# Patient Record
Sex: Male | Born: 1960 | Race: White | Hispanic: No | Marital: Single | Smoking: Former smoker
Health system: Southern US, Community
[De-identification: ages and names within clinical notes are randomized; demographics above are authoritative.]

## PROBLEM LIST (undated history)

## (undated) DIAGNOSIS — I10 Essential (primary) hypertension: Secondary | ICD-10-CM

## (undated) DIAGNOSIS — K219 Gastro-esophageal reflux disease without esophagitis: Secondary | ICD-10-CM

## (undated) HISTORY — PX: OTHER SURGICAL HISTORY: SHX169

## (undated) HISTORY — DX: Essential (primary) hypertension: I10

---

## 2020-11-22 DIAGNOSIS — I10 Essential (primary) hypertension: Secondary | ICD-10-CM | POA: Diagnosis not present

## 2020-11-22 DIAGNOSIS — F5221 Male erectile disorder: Secondary | ICD-10-CM | POA: Diagnosis not present

## 2020-11-22 DIAGNOSIS — Z7189 Other specified counseling: Secondary | ICD-10-CM | POA: Diagnosis not present

## 2020-11-29 DIAGNOSIS — I10 Essential (primary) hypertension: Secondary | ICD-10-CM | POA: Diagnosis not present

## 2020-12-06 DIAGNOSIS — F5221 Male erectile disorder: Secondary | ICD-10-CM | POA: Diagnosis not present

## 2020-12-06 DIAGNOSIS — I10 Essential (primary) hypertension: Secondary | ICD-10-CM | POA: Diagnosis not present

## 2021-03-07 DIAGNOSIS — I10 Essential (primary) hypertension: Secondary | ICD-10-CM | POA: Diagnosis not present

## 2021-03-14 DIAGNOSIS — I10 Essential (primary) hypertension: Secondary | ICD-10-CM | POA: Diagnosis not present

## 2021-03-14 DIAGNOSIS — E669 Obesity, unspecified: Secondary | ICD-10-CM | POA: Diagnosis not present

## 2021-03-14 DIAGNOSIS — Z6834 Body mass index (BMI) 34.0-34.9, adult: Secondary | ICD-10-CM | POA: Diagnosis not present

## 2021-03-14 DIAGNOSIS — F5221 Male erectile disorder: Secondary | ICD-10-CM | POA: Diagnosis not present

## 2021-03-20 ENCOUNTER — Encounter: Payer: Self-pay | Admitting: *Deleted

## 2021-04-06 ENCOUNTER — Ambulatory Visit (INDEPENDENT_AMBULATORY_CARE_PROVIDER_SITE_OTHER): Payer: Self-pay | Admitting: *Deleted

## 2021-04-06 ENCOUNTER — Encounter: Payer: Self-pay | Admitting: *Deleted

## 2021-04-06 ENCOUNTER — Other Ambulatory Visit: Payer: Self-pay

## 2021-04-06 VITALS — Ht 71.0 in | Wt 247.2 lb

## 2021-04-06 DIAGNOSIS — Z1211 Encounter for screening for malignant neoplasm of colon: Secondary | ICD-10-CM

## 2021-04-06 MED ORDER — PEG 3350-KCL-NA BICARB-NACL 420 G PO SOLR: 4000.0000 mL | Freq: Once | ORAL | 0 refills | Status: AC

## 2021-04-06 NOTE — Progress Notes (Signed)
Gastroenterology Pre-Procedure Review  Request Date: 04/06/2021 Requesting Physician: Dr. Nevada Crane, no previous TCS, family hx of colon cancer (uncle)  PATIENT REVIEW QUESTIONS: The patient responded to the following health history questions as indicated:    1. Diabetes Melitis: no 2. Joint replacements in the past 12 months: no 3. Major health problems in the past 3 months: yes, elevated blood pressure, PCP is managing 4. Has an artificial valve or MVP: no 5. Has a defibrillator: no 6. Has been advised in past to take antibiotics in advance of a procedure like teeth cleaning: no 7. Family history of colon cancer: yes, uncle: age unknown 55. Alcohol Use: no 9. Illicit drug Use: no 10. History of sleep apnea: no  11. History of coronary artery or other vascular stents placed within the last 12 months: no 12. History of any prior anesthesia complications: no 13. Body mass index is 34.48 kg/m.    MEDICATIONS & ALLERGIES:    Patient reports the following regarding taking any blood thinners:   Plavix? no Aspirin? Yes, 325 mg daily Coumadin? no Brilinta? no Xarelto? no Eliquis? no Pradaxa? no Savaysa? no Effient? no  Patient confirms/reports the following medications:  Current Outpatient Medications  Medication Sig Dispense Refill   acetaminophen (TYLENOL) 500 MG tablet Take 500 mg by mouth as needed.     aspirin 325 MG tablet Take 325 mg by mouth daily.     losartan (COZAAR) 50 MG tablet Take 50 mg by mouth daily.     sildenafil (VIAGRA) 50 MG tablet as needed.     No current facility-administered medications for this visit.    Patient confirms/reports the following allergies:  Allergies  Allergen Reactions   Penicillins     No orders of the defined types were placed in this encounter.   AUTHORIZATION INFORMATION Primary Insurance: [place],  [place] #: [id number],  Group #: [id number] Pre-Cert / Josem Kaufmann required: No, not required  SCHEDULE INFORMATION: Procedure has been  scheduled as follows:  Date: 05/10/2021, Time: 10:30  Location: [facility] with Dr. Gala Romney  This Gastroenterology Pre-Precedure Review Form is being routed to the following provider(s): Neil Crouch, PA-C

## 2021-04-10 NOTE — Progress Notes (Signed)
Ok to schedule conscious sedation. ASA II.  °

## 2021-04-11 NOTE — Progress Notes (Signed)
Neil Crouch, PA-C:  Will pt need to hold ASA 325 mg 5 days prior to procedure.  He is a Dr. Gala Romney pt?

## 2021-04-13 NOTE — Progress Notes (Signed)
No need to hold ASA.

## 2021-05-10 ENCOUNTER — Encounter (HOSPITAL_COMMUNITY): Payer: Self-pay | Admitting: Internal Medicine

## 2021-05-10 ENCOUNTER — Ambulatory Visit (HOSPITAL_COMMUNITY)
Admission: RE | Admit: 2021-05-10 | Discharge: 2021-05-10 | Disposition: A | Discharge status: Home / Self Care | Payer: BC Managed Care – PPO | Attending: Internal Medicine | Admitting: Internal Medicine

## 2021-05-10 ENCOUNTER — Other Ambulatory Visit: Payer: Self-pay

## 2021-05-10 ENCOUNTER — Encounter (HOSPITAL_COMMUNITY)
Admission: RE | Disposition: A | Discharge status: Home / Self Care | Payer: Self-pay | Source: Home / Self Care | Attending: Internal Medicine

## 2021-05-10 DIAGNOSIS — D123 Benign neoplasm of transverse colon: Secondary | ICD-10-CM | POA: Diagnosis not present

## 2021-05-10 DIAGNOSIS — K635 Polyp of colon: Secondary | ICD-10-CM

## 2021-05-10 DIAGNOSIS — Z1211 Encounter for screening for malignant neoplasm of colon: Secondary | ICD-10-CM | POA: Diagnosis not present

## 2021-05-10 DIAGNOSIS — Z800 Family history of malignant neoplasm of digestive organs: Secondary | ICD-10-CM | POA: Diagnosis not present

## 2021-05-10 DIAGNOSIS — K5730 Diverticulosis of large intestine without perforation or abscess without bleeding: Secondary | ICD-10-CM | POA: Diagnosis not present

## 2021-05-10 HISTORY — PX: POLYPECTOMY: SHX5525

## 2021-05-10 HISTORY — PX: HEMOSTASIS CLIP PLACEMENT: SHX6857

## 2021-05-10 HISTORY — PX: COLONOSCOPY: SHX5424

## 2021-05-10 SURGERY — COLONOSCOPY
Anesthesia: Moderate Sedation

## 2021-05-10 MED ORDER — MIDAZOLAM HCL 5 MG/5ML IJ SOLN
INTRAMUSCULAR | Status: AC
  Filled 2021-05-10: qty 10

## 2021-05-10 MED ORDER — ONDANSETRON HCL 4 MG/2ML IJ SOLN
INTRAMUSCULAR | Status: AC
  Filled 2021-05-10: qty 2

## 2021-05-10 MED ORDER — SODIUM CHLORIDE 0.9 % IV SOLN
INTRAVENOUS | Status: DC
  Administered 2021-05-10: 1000 mL via INTRAVENOUS

## 2021-05-10 MED ORDER — MEPERIDINE HCL 100 MG/ML IJ SOLN
INTRAMUSCULAR | Status: DC | PRN
  Administered 2021-05-10: 25 mg via INTRAVENOUS
  Administered 2021-05-10: 15 mg via INTRAVENOUS

## 2021-05-10 MED ORDER — MEPERIDINE HCL 50 MG/ML IJ SOLN
INTRAMUSCULAR | Status: AC
  Filled 2021-05-10: qty 1

## 2021-05-10 MED ORDER — ONDANSETRON HCL 4 MG/2ML IJ SOLN
INTRAMUSCULAR | Status: DC | PRN
  Administered 2021-05-10: 4 mg via INTRAVENOUS

## 2021-05-10 MED ORDER — MIDAZOLAM HCL 5 MG/5ML IJ SOLN
INTRAMUSCULAR | Status: DC | PRN
  Administered 2021-05-10 (×2): 2 mg via INTRAVENOUS
  Administered 2021-05-10: 1 mg via INTRAVENOUS

## 2021-05-10 NOTE — Op Note (Signed)
[facility] Patient Name: Ryan Wiley Procedure Date: 05/10/2021 9:30 AM MRN: [id number] Date of Birth: [birth year] Attending MD: Norvel Richards , MD CSN: [id number] Age: 60 Admit Type: Outpatient Procedure:                Colonoscopy Indications:              Screening for colorectal malignant neoplasm Providers:                Norvel Richards, MD, Caprice Kluver, Randa Spike, Technician Referring MD:              Medicines:                Midazolam 5 mg IV, Meperidine 40 mg IV Complications:            No immediate complications. Estimated Blood Loss:     Estimated blood loss was minimal. Procedure:                Pre-Anesthesia Assessment:                           - Prior to the procedure, a History and Physical                            was performed, and patient medications and                            allergies were reviewed. The patient's tolerance of                            previous anesthesia was also reviewed. The risks                            and benefits of the procedure and the sedation                            options and risks were discussed with the patient.                            All questions were answered, and informed consent                            was obtained. Prior Anticoagulants: The patient has                            taken no previous anticoagulant or antiplatelet                            agents. ASA Grade Assessment: II - A patient with                            mild systemic disease. After reviewing the risks  and benefits, the patient was deemed in                            satisfactory condition to undergo the procedure.                           After obtaining informed consent, the colonoscope                            was passed under direct vision. Throughout the                            procedure, the patient's blood pressure, pulse, and                             oxygen saturations were monitored continuously. The                            [id number]) scope was introduced through the                            anus and advanced to the the cecum, identified by                            appendiceal orifice and ileocecal valve. The                            colonoscopy was performed without difficulty. The                            patient tolerated the procedure well. The quality                            of the bowel preparation was adequate. Scope In: 10:06:16 AM Scope Out: 10:26:57 AM Scope Withdrawal Time: 0 hours 14 minutes 20 seconds  Total Procedure Duration: 0 hours 20 minutes 41 seconds  Findings:      The perianal and digital rectal examinations were normal.      A 6 mm polyp was found in the splenic flexure. The polyp was       semi-pedunculated. The polyp was removed with a cold snare. Resection       and retrieval were complete. Estimated blood loss was minimal.      Scattered medium-mouthed diverticula were found in the sigmoid colon and       descending colon.      The exam was otherwise without abnormality on direct and retroflexion       views. Impression:               - One 6 mm polyp at the splenic flexure, removed                            with a cold snare. Resected and retrieved.                           - Diverticulosis in  the sigmoid colon and in the                            descending colon.                           - The examination was otherwise normal on direct                            and retroflexion views. Moderate Sedation:      Moderate (conscious) sedation was administered by the endoscopy nurse       and supervised by the endoscopist. The following parameters were       monitored: oxygen saturation, heart rate, blood pressure, respiratory       rate, EKG, adequacy of pulmonary ventilation, and response to care.       Total physician intraservice time was 26  minutes. Recommendation:           - Patient has a contact number available for                            emergencies. The signs and symptoms of potential                            delayed complications were discussed with the                            patient. Return to normal activities tomorrow.                            Written discharge instructions were provided to the                            patient.                           - Advance diet as tolerated.                           - Continue present medications.                           - Repeat colonoscopy date to be determined after                            pending pathology results are reviewed for                            surveillance.                           - Return to GI office (date not yet determined). Procedure Code(s):        --- Professional ---                           [phone], Colonoscopy, flexible; with removal of  tumor(s), polyp(s), or other lesion(s) by snare                            technique                           99153, Moderate sedation; each additional 15                            minutes intraservice time                           G0500, Moderate sedation services provided by the                            same physician or other qualified health care                            professional performing a gastrointestinal                            endoscopic service that sedation supports,                            requiring the presence of an independent trained                            observer to assist in the monitoring of the                            patient's level of consciousness and physiological                            status; initial 15 minutes of intra-service time;                            patient age 55 years or older (additional time may                            be reported with [phone], as appropriate) Diagnosis Code(s):        ---  Professional ---                           Z12.11, Encounter for screening for malignant                            neoplasm of colon                           K63.5, Polyp of colon                           K57.30, Diverticulosis of large intestine without                            perforation or abscess without  bleeding CPT copyright 2019 American Medical Association. All rights reserved. The codes documented in this report are preliminary and upon coder review may  be revised to meet current compliance requirements. Cristopher Estimable. Matalie Romberger, MD Norvel Richards, MD 05/10/2021 10:40:50 AM This report has been signed electronically. Number of Addenda: 0

## 2021-05-10 NOTE — Interval H&P Note (Signed)
History and Physical Interval Note:  05/10/2021 9:46 AM  Ryan Wiley  has presented today for surgery, with the diagnosis of Screening 1st TCS.  The various methods of treatment have been discussed with the patient and family. After consideration of risks, benefits and other options for treatment, the patient has consented to  Procedure(s) with comments: COLONOSCOPY (N/A) - 10:30 as a surgical intervention.  The patient's history has been reviewed, patient examined, no change in status, stable for surgery.  I have reviewed the patient's chart and labs.  Questions were answered to the patient's satisfaction.     [name]

## 2021-05-10 NOTE — Discharge Instructions (Signed)
  Colonoscopy Discharge Instructions  Read the instructions outlined below and refer to this sheet in the next few weeks. These discharge instructions provide you with general information on caring for yourself after you leave the hospital. Your doctor may also give you specific instructions. While your treatment has been planned according to the most current medical practices available, unavoidable complications occasionally occur. If you have any problems or questions after discharge, call Dr. Gala Romney at [phone]. ACTIVITY You may resume your regular activity, but move at a slower pace for the next 24 hours.  Take frequent rest periods for the next 24 hours.  Walking will help get rid of the air and reduce the bloated feeling in your belly (abdomen).  No driving for 24 hours (because of the medicine (anesthesia) used during the test).   Do not sign any important legal documents or operate any machinery for 24 hours (because of the anesthesia used during the test).  NUTRITION Drink plenty of fluids.  You may resume your normal diet as instructed by your doctor.  Begin with a light meal and progress to your normal diet. Heavy or fried foods are harder to digest and may make you feel sick to your stomach (nauseated).  Avoid alcoholic beverages for 24 hours or as instructed.  MEDICATIONS You may resume your normal medications unless your doctor tells you otherwise.  WHAT YOU CAN EXPECT TODAY Some feelings of bloating in the abdomen.  Passage of more gas than usual.  Spotting of blood in your stool or on the toilet paper.  IF YOU HAD POLYPS REMOVED DURING THE COLONOSCOPY: No aspirin products for 7 days or as instructed.  No alcohol for 7 days or as instructed.  Eat a soft diet for the next 24 hours.  FINDING OUT THE RESULTS OF YOUR TEST Not all test results are available during your visit. If your test results are not back during the visit, make an appointment with your caregiver to find out the  results. Do not assume everything is normal if you have not heard from your caregiver or the medical facility. It is important for you to follow up on all of your test results.  SEEK IMMEDIATE MEDICAL ATTENTION IF: You have more than a spotting of blood in your stool.  Your belly is swollen (abdominal distention).  You are nauseated or vomiting.  You have a temperature over 101.  You have abdominal pain or discomfort that is severe or gets worse throughout the day.    1 polyp removed from your colon today  Colon polyp and diverticulosis information provided  Clip placed on polypectomy site to avoid any bleeding.  No future MRI until clip goes away  Further recommendations to follow pending review of pathology report  At patient request, called Matilde Sprang at [phone] and reviewed findings

## 2021-05-10 NOTE — H&P (Signed)
@  GBTD@   Primary Care Physician:  Celene Squibb, MD Primary Gastroenterologist:  Dr. Nevada Crane  Pre-Procedure History & Physical: HPI:  Ryan Wiley is a 60 y.o. male is here for a screening colonoscopy.   Here for first-ever screening colonoscopy.  Uncle with colon cancer.  Diagnosed in late 51s.  Patient without bowel symptoms.  No past medical history on file. Prior to Admission medications   Medication Sig Start Date End Date Taking? Authorizing Provider  acetaminophen (TYLENOL) 500 MG tablet Take 500 mg by mouth every 8 (eight) hours as needed for moderate pain.   Yes [provider]  aspirin 325 MG tablet Take 325 mg by mouth daily.   Yes [provider]  losartan (COZAAR) 50 MG tablet Take 50 mg by mouth daily. 03/15/21  Yes [provider]  Misc Natural Products (PROSTATE HEALTH PO) Take 1 capsule by mouth daily.   Yes [provider]  sildenafil (VIAGRA) 50 MG tablet Take 50 mg by mouth as needed for erectile dysfunction. 01/19/21  Yes [provider]    Allergies as of 04/11/2021 - Review Complete 04/06/2021  Allergen Reaction Noted   Penicillins  04/06/2021    No family history on file.  Social History   Socioeconomic History   Marital status: Single    Spouse name: Not on file   Number of children: Not on file   Years of education: Not on file   Highest education level: Not on file  Occupational History   Not on file  Tobacco Use   Smoking status: Former    Types: Cigarettes   Smokeless tobacco: Not on file  Vaping Use   Vaping Use: Never used  Substance and Sexual Activity   Alcohol use: Not Currently   Drug use: Not on file   Sexual activity: Never  Other Topics Concern   Not on file  Social History Narrative   Not on file   Social Determinants of Health   Financial Resource Strain: Not on file  Food Insecurity: Not on file  Transportation Needs: Not on file  Physical Activity: Not on file  Stress: Not  on file  Social Connections: Not on file  Intimate Partner Violence: Not on file    Review of Systems: See HPI, otherwise negative ROS  Physical Exam: There were no vitals taken for this visit. General:   Alert,  Well-developed, well-nourished, pleasant and cooperative in NAD Head:  Normocephalic and atraumatic. Lungs:  Clear throughout to auscultation.   No wheezes, crackles, or rhonchi. No acute distress. Heart:  Regular rate and rhythm; no murmurs, clicks, rubs,  or gallops. Abdomen:  Soft, nontender and nondistended. No masses, hepatosplenomegaly or hernias noted. Normal bowel sounds, without guarding, and without rebound.   Impression/Plan: Ryan Wiley is now here to undergo a screening colonoscopy.  Average risk screening examination  Risks, benefits, limitations, imponderables and alternatives regarding colonoscopy have been reviewed with the patient. Questions have been answered. All parties agreeable.     Notice:  This dictation was prepared with Dragon dictation along with smaller phrase technology. Any transcriptional errors that result from this process are unintentional and may not be corrected upon review.

## 2021-05-12 ENCOUNTER — Encounter (HOSPITAL_COMMUNITY): Payer: Self-pay | Admitting: Internal Medicine

## 2021-05-12 LAB — SURGICAL PATHOLOGY

## 2021-05-15 ENCOUNTER — Other Ambulatory Visit: Payer: Self-pay | Admitting: Internal Medicine

## 2021-05-22 ENCOUNTER — Encounter: Payer: Self-pay | Admitting: Internal Medicine

## 2021-05-23 ENCOUNTER — Telehealth: Payer: Self-pay | Admitting: Internal Medicine

## 2021-05-23 NOTE — Telephone Encounter (Signed)
No ans, vm full.  

## 2021-05-23 NOTE — Telephone Encounter (Signed)
Patient would like to speak to a nurse about his test results. 

## 2021-05-23 NOTE — Telephone Encounter (Signed)
Pt would like to be put on cancellation list with any of the apps for a sooner appt. Pt is worried about his recent colonoscopy results.

## 2021-06-14 ENCOUNTER — Ambulatory Visit: Payer: BC Managed Care – PPO | Admitting: Gastroenterology

## 2021-08-06 NOTE — Progress Notes (Signed)
? ? ?Referring Provider: Celene Squibb, MD ?Primary Care Physician:  Celene Squibb, MD ?Primary GI Physician: Dr. Gala Romney ? ?Chief Complaint  ?Patient presents with  ? Follow-up  ?  To colonoscopy  ?  ? ? ?HPI:   ?Ryan Wiley is a 61 y.o. male presenting today for follow-up s/p colonoscopy which was originally pursued for screening purposes.  His colonoscopy was completed 05/10/2021.  He was found to have diverticulosis in the sigmoid and descending colon and a 6 mm polyp at the splenic flexure that was resected and retrieved.  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  Recommended office visit follow-up to discuss scheduling EGD and VCE to make sure there are no other juvenile/hamartomatous polyps elsewhere as this finding raises the concern for the possibility of a genetic syndrome - Peutz jeghers syndrome. ? ?Today:  ?No known family history of polyposis syndrome.  He has never heard of.  Peutz-jeghers syndrome or juvenile polyposis syndrome.   ? ?Reports every now and then when eating steak, he will feel that he gets hung in the upper esophagus.  If he drinks soda, it will pass.  This first occurred 5-6 years ago.  He cannot remember the last time this occurred as it is very infrequent.  Reports father had to have his esophagus stretched intermittently.  No family history of Barrett's esophagus. ? ?No other GI concerns.  Denies abdominal pain, constipation, diarrhea, unintentional weight loss, BRBPR, melena, nausea, vomiting, heartburn symptoms. ? ?Tylenol for knee pain.  ? ?Has worked at the same [employer] since the 1980s. ? ? ?Past Medical History:  ?Diagnosis Date  ? HTN (hypertension)   ? ? ?Past Surgical History:  ?Procedure Laterality Date  ? COLONOSCOPY N/A 05/10/2021  ? Surgeon: Daneil Dolin, MD;  diverticulosis in the sigmoid and descending colon and a 6 mm benign, juvenile hamartomatous polyp.  ? growth removed from right leg Left   ? HEMOSTASIS CLIP PLACEMENT  05/10/2021   ? Procedure: HEMOSTASIS CLIP PLACEMENT;  Surgeon: Daneil Dolin, MD;  Location: [facility];  Service: Endoscopy;;  ? POLYPECTOMY  05/10/2021  ? Procedure: POLYPECTOMY;  Surgeon: Daneil Dolin, MD;  Location: [facility];  Service: Endoscopy;;  ? ? ?Current Outpatient Medications  ?Medication Sig Dispense Refill  ? acetaminophen (TYLENOL) 500 MG tablet Take 500 mg by mouth every 8 (eight) hours as needed for moderate pain.    ? aspirin 325 MG tablet Take 325 mg by mouth daily.    ? losartan (COZAAR) 50 MG tablet Take 50 mg by mouth daily.    ? Misc Natural Products (PROSTATE HEALTH PO) Take 1 capsule by mouth daily.    ? sildenafil (VIAGRA) 50 MG tablet Take 50 mg by mouth as needed for erectile dysfunction.    ? ?No current facility-administered medications for this visit.  ? ? ?Allergies as of 08/07/2021 - Review Complete 08/07/2021  ?Allergen Reaction Noted  ? Penicillins Hives 04/06/2021  ? ? ?Family History  ?Problem Relation Age of Onset  ? Colon cancer Neg Hx   ? Familial polyposis Neg Hx   ?     No familial polyposis syndromes, Peutz-jeghers or juvanile polyposis syndrome  ? Barrett's esophagus Neg Hx   ? Stomach cancer Neg Hx   ? Pancreatic cancer Neg Hx   ? Esophageal cancer Neg Hx   ? ? ?Social History  ? ?Socioeconomic History  ? Marital status: Single  ?  Spouse name: Not on file  ?  Number of children: Not on file  ? Years of education: Not on file  ? Highest education level: Not on file  ?Occupational History  ? Not on file  ?Tobacco Use  ? Smoking status: Former  ?  Types: Cigarettes  ?  Quit date: 39  ?  Years since quitting: 30.1  ? Smokeless tobacco: Never  ?Vaping Use  ? Vaping Use: Never used  ?Substance and Sexual Activity  ? Alcohol use: Yes  ?  Comment: rare  ? Drug use: Not Currently  ?  Types: Marijuana  ?  Comment: 1980s  ? Sexual activity: Never  ?Other Topics Concern  ? Not on file  ?Social History Narrative  ? Not on file  ? ?Social Determinants of Health  ? ?Financial  Resource Strain: Not on file  ?Food Insecurity: Not on file  ?Transportation Needs: Not on file  ?Physical Activity: Not on file  ?Stress: Not on file  ?Social Connections: Not on file  ? ? ?Review of Systems: ?Gen: Denies fever, chills, cold or flulike symptoms, presyncope, syncope. ?CV: Denies chest pain, palpitations. ?Resp: Denies dyspnea or cough. ?GI: See HPI ?Derm: Denies rash. ?Heme: See HPI ? ?Physical Exam: ?BP 132/68   Pulse 72   Temp (!) 97.1 ?F (36.2 ?C) (Temporal)   Ht '5\' 10"'$  (1.778 m)   Wt 251 lb 9.6 oz (114.1 kg)   BMI 36.10 kg/m?  ?General:   Alert and oriented. No distress noted. Pleasant and cooperative.  ?Head:  Normocephalic and atraumatic. ?Eyes:  Conjuctiva clear without scleral icterus. ?Mouth:  Oral mucosa pink and moist. No lesions.  Specifically, no melanin spots. ?Nose: No melanin spots. ?Heart:  S1, S2 present without murmurs appreciated. ?Lungs:  Clear to auscultation bilaterally. No wheezes, rales, or rhonchi. No distress.  ?Abdomen:  +BS, soft, non-tender and non-distended. No rebound or guarding. No HSM or masses noted.  No skin lesions. ?Msk:  Symmetrical without gross deformities. Normal posture. ?Extremities:  Without edema or melanin spots on hands. ?Neurologic:  Alert and  oriented x4 ?Psych:  Normal mood and affect.  ? ? ?Assessment: ?61 year old male with history of HTN, presenting today for follow-up s/p colonoscopy completed in December 2022, originally pursued for screening purposes.  He was found to have diverticulosis in the sigmoid and descending colon and a 6 mm polyp at the splenic flexure that was removed.  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  Recommended office visit follow-up to discuss EGD and VCE to ensure no other juvenile/hamartomatous polyps elsewhere in his GI tract. ? ?History of hamartomatous polyp of large intestine: ?Single 6 mm benign, juvenile hamartomatous polyp on colonoscopy in December 2022.  Clinically, patient is doing  very well.  He has no known family history of Peutz-jeghers syndrome, juvenile polyposis syndrome, or any other familial polyposis syndromes. No family history of GI cancers including colon, stomach, esophagus, or pancreatic. On exam, he does not have any melanin spots characteristic of Peutz-jeghers syndrome. Thus at this time, he does not meet criteria for diagnosis of PJS. We will arrange for EGD and VCE in the near future to ensure no other juvenile/hamartomatous polyps elsewhere in his GI tract. ? ?Dysphagia:  ?Patient reports 5-6-year of infrequent dysphagia if eating steak with sensation of food getting hung in his upper esophagus and passing with drinking soda.  Cannot remember the last time this occurred.  No history of GERD or other significant upper GI symptoms.  As we are pursuing an EGD due to his  history of hamartomatous polyp as per above, will add on possible dilation as apprepriate.  Dysphagia precautions discussed. ? ? ?Plan: ?EGD +/-dilation with propofol + Givens capsule placement with Dr. Gala Romney in the near future.The risks, benefits, and alternatives have been discussed with the patient in detail. The patient states understanding and desires to proceed. ?ASA 2 ?Eat slowly, take small bites, chew thoroughly, drink plenty of liquids throughout meals. ?Follow-up after procedures. ? ? ?Aliene Altes, PA-C ?[facility] ?08/07/2021 ? ?

## 2021-08-07 ENCOUNTER — Other Ambulatory Visit: Payer: Self-pay

## 2021-08-07 ENCOUNTER — Encounter: Payer: Self-pay | Admitting: Gastroenterology

## 2021-08-07 ENCOUNTER — Ambulatory Visit: Payer: BC Managed Care – PPO | Admitting: Gastroenterology

## 2021-08-07 VITALS — BP 132/68 | HR 72 | Temp 97.1°F | Ht 70.0 in | Wt 251.6 lb

## 2021-08-07 DIAGNOSIS — R1310 Dysphagia, unspecified: Secondary | ICD-10-CM | POA: Diagnosis not present

## 2021-08-07 DIAGNOSIS — Q859 Phakomatosis, unspecified: Secondary | ICD-10-CM

## 2021-08-07 NOTE — Patient Instructions (Addendum)
We will arrange for you to have an upper endoscopy with possible stretching of your esophagus and Givens capsule placement (camera to look at the small intestine) in the near future with Dr. Gala Romney. ? ?To help prevent food from getting stuck in your esophagus: ?Eat slowly, take small bites, chew thoroughly, drink plenty of liquids throughout meals. ? ?We will follow-up with you in the office after your procedures.  Do not hesitate to call if you have questions or concerns prior to your next visit. ? ?It was a pleasure meeting you today!   ? ?Aliene Altes, PA-C ?[facility] ? ?

## 2021-08-08 ENCOUNTER — Telehealth: Payer: Self-pay | Admitting: *Deleted

## 2021-08-08 NOTE — Telephone Encounter (Signed)
Called pt. Scheduled for EGD +/-dil with capsule placement, ASA 2 on 3/24 at 9:45am. Aware will send instructions.  ? ?PER AIM website "The following solutions for the service date entered do not require Pre-Authorization by [employer]. Please note that benefit limits, if applicable, will still be applied. Contact the health plan using the number on the back of the member's ID card if you have any questions regarding coverage or Pre-Authorization requirements." ?

## 2021-08-22 ENCOUNTER — Telehealth: Payer: Self-pay | Admitting: Internal Medicine

## 2021-08-22 NOTE — Telephone Encounter (Signed)
Pre service center called to give Auth # [id number] and it was pending. She said the cpt code was 91110 and the clinical notes needed to be faxed before 423 pm tomorrow or it would be canceled. Fax [phone] ?

## 2021-08-23 NOTE — Telephone Encounter (Signed)
PA approved for GIVENS. Auth# [id number], DOS: 08/25/2021-11/23/2021 ?

## 2021-08-23 NOTE — Telephone Encounter (Signed)
Clinicals faxed

## 2021-08-24 ENCOUNTER — Telehealth: Payer: Self-pay | Admitting: Internal Medicine

## 2021-08-24 NOTE — Telephone Encounter (Signed)
Spoke with pt. On scheduled for procedure tomorrow EGD/DIL/GIVENS and is needing to reschedule. He has been moved to 4/10 at 10:30am. Message sent to endo. Message also sent to [facility] regarding ready. New instructions mailed. ?

## 2021-08-24 NOTE — Telephone Encounter (Signed)
Patient needs to reschedule procedure. he has a cough  ?

## 2021-09-02 HISTORY — PX: ESOPHAGOGASTRODUODENOSCOPY: SHX1529

## 2021-09-05 NOTE — Patient Instructions (Signed)
? ? ? ? ? ? Ryan Wiley ? 09/05/2021  ?  ? '@PREFPERIOPPHARMACY'$ @ ? ? Your procedure is scheduled on  09/11/2021. ? ? Report to [facility] at  0900 A.M. ? ? Call this number if you have problems the morning of surgery: ? [phone] ? ? Remember: ? Follow the diet instructions given to you by the office. ?  ? Take these medicines the morning of surgery with A SIP OF WATER  ? ?None ?  ? Do not wear jewelry, make-up or nail polish. ? Do not wear lotions, powders, or perfumes, or deodorant. ? Do not shave 48 hours prior to surgery.  Men may shave face and neck. ? Do not bring valuables to the hospital. ? [facility] is not responsible for any belongings or valuables. ? ?Contacts, dentures or bridgework may not be worn into surgery.  Leave your suitcase in the car.  After surgery it may be brought to your room. ? ?For patients admitted to the hospital, discharge time will be determined by your treatment team. ? ?Patients discharged the day of surgery will not be allowed to drive home and must have someone with them for 24 hours.  ? ? ?Special instructions:   DO NOT smoke tobacco or vape for 24 hours before your procedure. ? ?Please read over the following fact sheets that you were given. ?Anesthesia Post-op Instructions and Care and Recovery After Surgery ?  ? ? ? Upper Endoscopy, Adult, Care After ?This sheet gives you information about how to care for yourself after your procedure. Your health care provider may also give you more specific instructions. If you have problems or questions, contact your health care provider. ?What can I expect after the procedure? ?After the procedure, it is common to have: ?A sore throat. ?Mild stomach pain or discomfort. ?Bloating. ?Nausea. ?Follow these instructions at home: ? ?Follow instructions from your health care provider about what to eat or drink after your procedure. ?Return to your normal activities as told by your health care provider. Ask your health care provider what  activities are safe for you. ?Take over-the-counter and prescription medicines only as told by your health care provider. ?If you were given a sedative during the procedure, it can affect you for several hours. Do not drive or operate machinery until your health care provider says that it is safe. ?Keep all follow-up visits as told by your health care provider. This is important. ?Contact a health care provider if you have: ?A sore throat that lasts longer than one day. ?Trouble swallowing. ?Get help right away if: ?You vomit blood or your vomit looks like coffee grounds. ?You have: ?A fever. ?Bloody, black, or tarry stools. ?A severe sore throat or you cannot swallow. ?Difficulty breathing. ?Severe pain in your chest or abdomen. ?Summary ?After the procedure, it is common to have a sore throat, mild stomach discomfort, bloating, and nausea. ?If you were given a sedative during the procedure, it can affect you for several hours. Do not drive or operate machinery until your health care provider says that it is safe. ?Follow instructions from your health care provider about what to eat or drink after your procedure. ?Return to your normal activities as told by your health care provider. ?This information is not intended to replace advice given to you by your health care provider. Make sure you discuss any questions you have with your health care provider. ?Document Revised: 03/27/2019 Document Reviewed: 10/21/2017 ?Elsevier Patient Education ? 2022 Elsevier  Inc. ?Monitored Anesthesia Care, Care After ?This sheet gives you information about how to care for yourself after your procedure. Your health care provider may also give you more specific instructions. If you have problems or questions, contact your health care provider. ?What can I expect after the procedure? ?After the procedure, it is common to have: ?Tiredness. ?Forgetfulness about what happened after the procedure. ?Impaired judgment for important  decisions. ?Nausea or vomiting. ?Some difficulty with balance. ?Follow these instructions at home: ?For the time period you were told by your health care provider: ?  ?Rest as needed. ?Do not participate in activities where you could fall or become injured. ?Do not drive or use machinery. ?Do not drink alcohol. ?Do not take sleeping pills or medicines that cause drowsiness. ?Do not make important decisions or sign legal documents. ?Do not take care of children on your own. ?Eating and drinking ?Follow the diet that is recommended by your health care provider. ?Drink enough fluid to keep your urine pale yellow. ?If you vomit: ?Drink water, juice, or soup when you can drink without vomiting. ?Make sure you have little or no nausea before eating solid foods. ?General instructions ?Have a responsible adult stay with you for the time you are told. It is important to have someone help care for you until you are awake and alert. ?Take over-the-counter and prescription medicines only as told by your health care provider. ?If you have sleep apnea, surgery and certain medicines can increase your risk for breathing problems. Follow instructions from your health care provider about wearing your sleep device: ?Anytime you are sleeping, including during daytime naps. ?While taking prescription pain medicines, sleeping medicines, or medicines that make you drowsy. ?Avoid smoking. ?Keep all follow-up visits as told by your health care provider. This is important. ?Contact a health care provider if: ?You keep feeling nauseous or you keep vomiting. ?You feel light-headed. ?You are still sleepy or having trouble with balance after 24 hours. ?You develop a rash. ?You have a fever. ?You have redness or swelling around the IV site. ?Get help right away if: ?You have trouble breathing. ?You have new-onset confusion at home. ?Summary ?For several hours after your procedure, you may feel tired. You may also be forgetful and have poor  judgment. ?Have a responsible adult stay with you for the time you are told. It is important to have someone help care for you until you are awake and alert. ?Rest as told. Do not drive or operate machinery. Do not drink alcohol or take sleeping pills. ?Get help right away if you have trouble breathing, or if you suddenly become confused. ?This information is not intended to replace advice given to you by your health care provider. Make sure you discuss any questions you have with your health care provider. ?Document Revised: 02/04/2020 Document Reviewed: 04/23/2019 ?Elsevier Patient Education ? [place]. ? ?

## 2021-09-06 ENCOUNTER — Encounter (HOSPITAL_COMMUNITY)
Admission: RE | Admit: 2021-09-06 | Discharge: 2021-09-06 | Disposition: A | Discharge status: Home / Self Care | Payer: BC Managed Care – PPO | Source: Ambulatory Visit | Attending: Internal Medicine | Admitting: Internal Medicine

## 2021-09-06 HISTORY — DX: Gastro-esophageal reflux disease without esophagitis: K21.9

## 2021-09-07 ENCOUNTER — Encounter (HOSPITAL_COMMUNITY): Payer: Self-pay

## 2021-09-07 DIAGNOSIS — I10 Essential (primary) hypertension: Secondary | ICD-10-CM | POA: Diagnosis not present

## 2021-09-11 ENCOUNTER — Ambulatory Visit (HOSPITAL_COMMUNITY)
Admission: RE | Admit: 2021-09-11 | Discharge: 2021-09-11 | Disposition: A | Discharge status: Home / Self Care | Payer: BC Managed Care – PPO | Attending: Internal Medicine | Admitting: Internal Medicine

## 2021-09-11 ENCOUNTER — Ambulatory Visit (HOSPITAL_COMMUNITY): Payer: BC Managed Care – PPO | Admitting: Anesthesiology

## 2021-09-11 ENCOUNTER — Other Ambulatory Visit: Payer: Self-pay

## 2021-09-11 ENCOUNTER — Encounter (HOSPITAL_COMMUNITY): Payer: Self-pay | Admitting: Internal Medicine

## 2021-09-11 ENCOUNTER — Encounter (HOSPITAL_COMMUNITY)
Admission: RE | Disposition: A | Discharge status: Home / Self Care | Payer: Self-pay | Source: Home / Self Care | Attending: Internal Medicine

## 2021-09-11 DIAGNOSIS — R933 Abnormal findings on diagnostic imaging of other parts of digestive tract: Secondary | ICD-10-CM | POA: Insufficient documentation

## 2021-09-11 DIAGNOSIS — K449 Diaphragmatic hernia without obstruction or gangrene: Secondary | ICD-10-CM | POA: Diagnosis not present

## 2021-09-11 DIAGNOSIS — Z860100 Personal history of colon polyps, unspecified: Secondary | ICD-10-CM

## 2021-09-11 DIAGNOSIS — R1314 Dysphagia, pharyngoesophageal phase: Secondary | ICD-10-CM | POA: Diagnosis not present

## 2021-09-11 DIAGNOSIS — K31A19 Gastric intestinal metaplasia without dysplasia, unspecified site: Secondary | ICD-10-CM | POA: Diagnosis not present

## 2021-09-11 DIAGNOSIS — R1310 Dysphagia, unspecified: Secondary | ICD-10-CM

## 2021-09-11 DIAGNOSIS — K2100 Gastro-esophageal reflux disease with esophagitis, without bleeding: Secondary | ICD-10-CM | POA: Insufficient documentation

## 2021-09-11 DIAGNOSIS — K2270 Barrett's esophagus without dysplasia: Secondary | ICD-10-CM | POA: Diagnosis not present

## 2021-09-11 DIAGNOSIS — Z87891 Personal history of nicotine dependence: Secondary | ICD-10-CM | POA: Diagnosis not present

## 2021-09-11 DIAGNOSIS — I10 Essential (primary) hypertension: Secondary | ICD-10-CM | POA: Insufficient documentation

## 2021-09-11 DIAGNOSIS — K2210 Ulcer of esophagus without bleeding: Secondary | ICD-10-CM | POA: Diagnosis not present

## 2021-09-11 DIAGNOSIS — K222 Esophageal obstruction: Secondary | ICD-10-CM | POA: Diagnosis not present

## 2021-09-11 SURGERY — ESOPHAGOGASTRODUODENOSCOPY (EGD) WITH PROPOFOL
Anesthesia: General

## 2021-09-11 MED ORDER — LACTATED RINGERS IV SOLN: INTRAVENOUS | Status: DC

## 2021-09-11 MED ORDER — LIDOCAINE HCL (CARDIAC) PF 100 MG/5ML IV SOSY
PREFILLED_SYRINGE | INTRAVENOUS | Status: DC | PRN
  Administered 2021-09-11: 50 mg via INTRAVENOUS

## 2021-09-11 MED ORDER — PROPOFOL 10 MG/ML IV BOLUS
INTRAVENOUS | Status: DC | PRN
  Administered 2021-09-11: 100 mg via INTRAVENOUS
  Administered 2021-09-11: 50 mg via INTRAVENOUS

## 2021-09-11 MED ORDER — PROPOFOL 500 MG/50ML IV EMUL
INTRAVENOUS | Status: DC | PRN
Start: 2021-09-11 — End: 2021-09-11
  Administered 2021-09-11: 150 ug/kg/min via INTRAVENOUS

## 2021-09-11 NOTE — Anesthesia Postprocedure Evaluation (Signed)
Anesthesia Post Note ? ?Patient: Balin Vandegrift Eads ? ?Procedure(s) Performed: ESOPHAGOGASTRODUODENOSCOPY (EGD) WITH PROPOFOL ?MALONEY DILATION ?GIVENS CAPSULE STUDY ?BIOPSY ? ?Patient location during evaluation: Phase II ?Anesthesia Type: General ?Level of consciousness: awake and alert and oriented ?Pain management: pain level controlled ?Vital Signs Assessment: post-procedure vital signs reviewed and stable ?Respiratory status: spontaneous breathing, nonlabored ventilation and respiratory function stable ?Cardiovascular status: blood pressure returned to baseline and stable ?Postop Assessment: no apparent nausea or vomiting ?Anesthetic complications: no ? ? ?No notable events documented. ? ? ?Last Vitals:  ?Vitals:  ? 09/11/21 0948 09/11/21 1103  ?BP: (!) 159/96 100/64  ?Pulse: 66 66  ?Resp: 18 18  ?Temp: 36.7 ?C 36.4 ?C  ?SpO2: 98% 98%  ?  ?Last Pain:  ?Vitals:  ? 09/11/21 1103  ?TempSrc: Axillary  ?PainSc: 0-No pain  ? ? ?  ?  ?  ?  ?  ?  ? ?Cielle Aguila C Chrystie Hagwood ? ? ? ? ?

## 2021-09-11 NOTE — Transfer of Care (Signed)
Immediate Anesthesia Transfer of Care Note ? ?Patient: Ryan Wiley ? ?Procedure(s) Performed: ESOPHAGOGASTRODUODENOSCOPY (EGD) WITH PROPOFOL ?MALONEY DILATION ?GIVENS CAPSULE STUDY ?BIOPSY ? ?Patient Location: PACU ? ?Anesthesia Type:General ? ?Level of Consciousness: awake, alert , oriented and patient cooperative ? ?Airway & Oxygen Therapy: Patient Spontanous Breathing ? ?Post-op Assessment: Report given to RN, Post -op Vital signs reviewed and stable and Patient moving all extremities X 4 ? ?Post vital signs: Reviewed and stable ? ?Last Vitals:  ?Vitals Value Taken Time  ?BP    ?Temp    ?Pulse    ?Resp    ?SpO2    ? ? ?Last Pain:  ?Vitals:  ? 09/11/21 1038  ?PainSc: 0-No pain  ?   ? ?  ? ?Complications: No notable events documented. ?

## 2021-09-11 NOTE — Op Note (Signed)
[facility] ?Patient Name: Ryan Wiley ?Procedure Date: 09/11/2021 10:23 AM ?MRN: [id number] ?Date of Birth: 02/15/1961 ?Attending MD: Norvel Richards , MD ?CSN: [id number] ?Age: 61 ?Admit Type: Outpatient ?Procedure:                Upper GI endoscopy ?Indications:              Dysphagia / Capsule placement ?Providers:                Norvel Richards, MD, Rosina Lowenstein, RN, Eugene Garnet  ?                          Shanon Brow, Technician ?Referring MD:              ?Medicines:                Propofol per Anesthesia ?Complications:            No immediate complications. ?Estimated Blood Loss:     Estimated blood loss was minimal. ?Procedure:                Pre-Anesthesia Assessment: ?                          - Prior to the procedure, a History and Physical  ?                          was performed, and patient medications and  ?                          allergies were reviewed. The patient's tolerance of  ?                          previous anesthesia was also reviewed. The risks  ?                          and benefits of the procedure and the sedation  ?                          options and risks were discussed with the patient.  ?                          All questions were answered, and informed consent  ?                          was obtained. Prior Anticoagulants: The patient has  ?                          taken no previous anticoagulant or antiplatelet  ?                          agents. ASA Grade Assessment: III - A patient with  ?                          severe systemic disease. After reviewing the risks  ?  and benefits, the patient was deemed in  ?                          satisfactory condition to undergo the procedure. ?                          After obtaining informed consent, the endoscope was  ?                          passed under direct vision. Throughout the  ?                          procedure, the patient's blood pressure, pulse, and  ?                           oxygen saturations were monitored continuously. The  ?                          GIF-H190 ([id number]) scope was introduced through the  ?                          mouth, and advanced to the second part of duodenum.  ?                          The upper GI endoscopy was accomplished without  ?                          difficulty. The patient tolerated the procedure  ?                          well. ?Scope In: 10:42:48 AM ?Scope Out: 10:59:40 AM ?Total Procedure Duration: 0 hours 16 minutes 52 seconds  ?Findings: ?     Salmon-colored epithelium consistent with Barrett's esophagus coming up  ?     confluently from the EG junction (38 cm to 33 cm) from the incisors;  ?     [place] (M5/C4); no nodularity. However, proximal to the leading edge of  ?     Barrett's there is 3 to 4 cm of geographic ulcerations erosions  ?     proximally. Please see photos. Mild narrowing at the level of  ?     transition. Esophagus remained widely patent scope easily traversed. 3  ?     cm hiatal hernia; otherwise normal stomach. Patent pylorus. ?     The duodenal bulb and second portion of the duodenum were normal. Scope  ?     was withdrawn. Capsule deployment device was loaded up capsule was  ?     attached scope was reintroduced into the stomach and passed across the  ?     pylorus capsule deployed. Scope was withdrawn, a 80 Bronson  ?     dilator was passed to full insertion with mild resistance. A look back  ?     revealed no apparent complication related to passage of the dilator.  ?     Finally, segment of biopsies of the salmon-colored epithelium taken for  ?     histologic study (38, 36, 34 cm) ?Impression:               -  Rather severe erosive ulcerative reflux  ?                          esophagitis. Barrett's esophagus as described mild  ?                          stricture status post dilation. Hiatal hernia. ?                          -Status post small bowel capsule deployment; status  ?                          post  esophageal dilation; status post segmental  ?                          esophageal biopsies ?                          - No specimens collected. ?Moderate Sedation: ?     Moderate (conscious) sedation was personally administered by an  ?     anesthesia professional. The following parameters were monitored: oxygen  ?     saturation, heart rate, blood pressure, and response to care. ?Recommendation:           - Patient has a contact number available for  ?                          emergencies. The signs and symptoms of potential  ?                          delayed complications were discussed with the  ?                          patient. Return to normal activities tomorrow.  ?                          Written discharge instructions were provided to the  ?                          patient. ?                          - Clear liquid diet today. Begin Protonix 40 mg  ?                          twice daily. New prescription provided. Capsule  ?                          images to be reviewed. Path to be followed up upon.  ?                          Office visit 3 months. ?Procedure Code(s):        --- Professional --- ?                          [phone], Esophagogastroduodenoscopy, flexible,  ?  transoral; diagnostic, including collection of  ?                          specimen(s) by brushing or washing, when performed  ?                          (separate procedure) ?Diagnosis Code(s):        --- Professional --- ?                          R13.10, Dysphagia, unspecified ?CPT copyright 2019 American Medical Association. All rights reserved. ?The codes documented in this report are preliminary and upon coder review may  ?be revised to meet current compliance requirements. ?Cristopher Estimable. Ki Corbo, MD ?Norvel Richards, MD ?09/11/2021 11:21:02 AM ?This report has been signed electronically. ?Number of Addenda: 0 ?

## 2021-09-11 NOTE — H&P (Signed)
?$'@LOGO'l$ @ ? ? ?Primary Care Physician:  Celene Squibb, MD ?Primary Gastroenterologist:  Dr. Gala Romney ? ?Pre-Procedure History & Physical: ?HPI:  Ryan Wiley is a 61 y.o. male here for history of GERD and intermittent esophageal dysphagia.  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  Intermittent esophageal dysphagia for some time. ? ?Takes OTC antacids for reflux at least 10 times monthly. ? ?Past Medical History:  ?Diagnosis Date  ? GERD (gastroesophageal reflux disease)   ? HTN (hypertension)   ? ? ?Past Surgical History:  ?Procedure Laterality Date  ? COLONOSCOPY N/A 05/10/2021  ? Surgeon: Daneil Dolin, MD;  diverticulosis in the sigmoid and descending colon and a 6 mm benign, juvenile hamartomatous polyp.  ? growth removed from right leg Left   ? HEMOSTASIS CLIP PLACEMENT  05/10/2021  ? Procedure: HEMOSTASIS CLIP PLACEMENT;  Surgeon: Daneil Dolin, MD;  Location: [facility];  Service: Endoscopy;;  ? POLYPECTOMY  05/10/2021  ? Procedure: POLYPECTOMY;  Surgeon: Daneil Dolin, MD;  Location: [facility];  Service: Endoscopy;;  ? ? ?Prior to Admission medications   ?Medication Sig Start Date End Date Taking? Authorizing Provider  ?losartan (COZAAR) 50 MG tablet Take 50 mg by mouth daily. 03/15/21  Yes [provider]  ?sildenafil (VIAGRA) 50 MG tablet Take 50 mg by mouth as needed for erectile dysfunction. 01/19/21  Yes [provider]  ?acetaminophen (TYLENOL) 500 MG tablet Take 1,000 mg by mouth daily in the afternoon.    [provider]  ?aspirin 325 MG tablet Take 325 mg by mouth daily.    [provider]  ?polyethylene glycol-electrolytes (NULYTELY) 420 g solution Take 4,000 mLs by mouth once. 04/06/21   [provider]  ? ? ?Allergies as of 08/08/2021 - Review Complete 08/07/2021  ?Allergen Reaction Noted  ? Penicillins Hives 04/06/2021  ? ? ?Family History  ?Problem Relation  Age of Onset  ? Colon cancer Neg Hx   ? Familial polyposis Neg Hx   ?     No familial polyposis syndromes, Peutz-jeghers or juvanile polyposis syndrome  ? Barrett's esophagus Neg Hx   ? Stomach cancer Neg Hx   ? Pancreatic cancer Neg Hx   ? Esophageal cancer Neg Hx   ? ? ?Social History  ? ?Socioeconomic History  ? Marital status: Single  ?  Spouse name: Not on file  ? Number of children: Not on file  ? Years of education: Not on file  ? Highest education level: Not on file  ?Occupational History  ? Not on file  ?Tobacco Use  ? Smoking status: Former  ?  Types: Cigarettes  ?  Quit date: 74  ?  Years since quitting: 30.2  ? Smokeless tobacco: Never  ?Vaping Use  ? Vaping Use: Never used  ?Substance and Sexual Activity  ? Alcohol use: Yes  ?  Comment: rare  ? Drug use: Not Currently  ?  Types: Marijuana  ?  Comment: 1980s  ? Sexual activity: Never  ?Other Topics Concern  ? Not on file  ?Social History Narrative  ? Not on file  ? ?Social Determinants of Health  ? ?Financial Resource Strain: Not on file  ?Food Insecurity: Not on file  ?Transportation Needs: Not on file  ?Physical Activity: Not on file  ?Stress: Not on file  ?Social Connections: Not on file  ?Intimate Partner Violence: Not on file  ? ? ?Review  of Systems: ?See HPI, otherwise negative ROS ? ?Physical Exam: ?BP (!) 159/96 (BP Location: Right Arm)   Pulse 66   Temp 98.1 ?F (36.7 ?C)   Resp 18   Ht '5\' 10"'$  (1.778 m)   Wt 111 kg   SpO2 98%   BMI 35.11 kg/m?  ?General:   Alert,  Well-developed, well-nourished, pleasant and cooperative in NAD ?Neck:  Supple; no masses or thyromegaly. No significant cervical adenopathy. ?Lungs:  Clear throughout to auscultation.   No wheezes, crackles, or rhonchi. No acute distress. ?Heart:  Regular rate and rhythm; no murmurs, clicks, rubs,  or gallops. ?Abdomen: Non-distended, normal bowel sounds.  Soft and nontender without appreciable mass or hepatosplenomegaly.  ?Pulses:  Normal pulses noted. ?Extremities:  Without  clubbing or edema. ? 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  Here for further assessment of his GI tract with EGD and VCE. ? ? ?The risks, benefits, limitations, alternatives and imponderables have been reviewed with the patient. Potential for esophageal dilation, biopsy, etc. have also been reviewed.  Questions have been answered. All parties agreeable.  ? ? ? ? ?Notice: This dictation was prepared with Dragon dictation along with smaller phrase technology. Any transcriptional errors that result from this process are unintentional and may not be corrected upon review.  ?

## 2021-09-11 NOTE — Discharge Instructions (Signed)
EGD ?Discharge instructions ?Please read the instructions outlined below and refer to this sheet in the next few weeks. These discharge instructions provide you with general information on caring for yourself after you leave the hospital. Your doctor may also give you specific instructions. While your treatment has been planned according to the most current medical practices available, unavoidable complications occasionally occur. If you have any problems or questions after discharge, please call your doctor. ?ACTIVITY ?You may resume your regular activity but move at a slower pace for the next 24 hours.  ?Take frequent rest periods for the next 24 hours.  ?Walking will help expel (get rid of) the air and reduce the bloated feeling in your abdomen.  ?No driving for 24 hours (because of the anesthesia (medicine) used during the test).  ?You may shower.  ?Do not sign any important legal documents or operate any machinery for 24 hours (because of the anesthesia used during the test).  ?NUTRITION ?Drink plenty of fluids.  ?You may resume your normal diet.  ?Begin with a light meal and progress to your normal diet.  ?Avoid alcoholic beverages for 24 hours or as instructed by your caregiver.  ?MEDICATIONS ?You may resume your normal medications unless your caregiver tells you otherwise.  ?WHAT YOU CAN EXPECT TODAY ?You may experience abdominal discomfort such as a feeling of fullness or ?gas? pains.  ?FOLLOW-UP ?Your doctor will discuss the results of your test with you.  ?SEEK IMMEDIATE MEDICAL ATTENTION IF ANY OF THE FOLLOWING OCCUR: ?Excessive nausea (feeling sick to your stomach) and/or vomiting.  ?Severe abdominal pain and distention (swelling).  ?Trouble swallowing.  ?Temperature over 101? F (37.8? C).  ?Rectal bleeding or vomiting of blood.   ? ? ?You have severe acid reflux injury and scarring in your esophagus with mild narrowing. ? ?Your esophagus was biopsied and stretched today ? ?Capsule was placed in your  small intestine ? ?Begin Protonix 40 mg twice daily (30 minutes before breakfast and supper) new prescription provided ? ?Further recommendations to follow pending review of pathology report and capsule images ? ?Office visit with [place] in 3 months ? ?At patient request, I called Lyn Henri at [phone] ?

## 2021-09-11 NOTE — Anesthesia Preprocedure Evaluation (Signed)
Anesthesia Evaluation  ?Patient identified by MRN, date of birth, ID band ?Patient awake ? ? ? ?Reviewed: ?Allergy & Precautions, NPO status , Patient's Chart, lab work & pertinent test results ? ?Airway ?Mallampati: II ? ?TM Distance: >3 FB ?Neck ROM: Full ? ? ? Dental ? ?(+) Dental Advisory Given, Chipped, Missing ?  ?Pulmonary ?former smoker,  ?  ?Pulmonary exam normal ?breath sounds clear to auscultation ? ? ? ? ? ? Cardiovascular ?hypertension, Pt. on medications ?Normal cardiovascular exam ?Rhythm:Regular Rate:Normal ? ? ?  ?Neuro/Psych ?negative neurological ROS ? negative psych ROS  ? GI/Hepatic ?Neg liver ROS, GERD  Medicated,  ?Endo/Other  ?negative endocrine ROS ? Renal/GU ?negative Renal ROS  ?negative genitourinary ?  ?Musculoskeletal ?negative musculoskeletal ROS ?(+)  ? Abdominal ?  ?Peds ?negative pediatric ROS ?(+)  Hematology ?negative hematology ROS ?(+)   ?Anesthesia Other Findings ? ? Reproductive/Obstetrics ?negative OB ROS ? ?  ? ? ? ? ? ? ? ? ? ? ? ? ? ?  ?  ? ? ? ? ? ? ? ?Anesthesia Physical ?Anesthesia Plan ? ?ASA: 2 ? ?Anesthesia Plan: General  ? ?Post-op Pain Management: Minimal or no pain anticipated  ? ?Induction: Intravenous ? ?PONV Risk Score and Plan: Propofol infusion ? ?Airway Management Planned: Nasal Cannula and Natural Airway ? ?Additional Equipment:  ? ?Intra-op Plan:  ? ?Post-operative Plan:  ? ?Informed Consent: I have reviewed the patients History and Physical, chart, labs and discussed the procedure including the risks, benefits and alternatives for the proposed anesthesia with the patient or authorized representative who has indicated his/her understanding and acceptance.  ? ? ? ?Dental advisory given ? ?Plan Discussed with: CRNA and Surgeon ? ?Anesthesia Plan Comments:   ? ? ? ? ? ?Anesthesia Quick Evaluation ? ?

## 2021-09-12 LAB — SURGICAL PATHOLOGY

## 2021-09-13 ENCOUNTER — Encounter: Payer: Self-pay | Admitting: Internal Medicine

## 2021-09-20 ENCOUNTER — Telehealth: Payer: Self-pay | Admitting: Gastroenterology

## 2021-09-20 DIAGNOSIS — Z86010 Personal history of colonic polyps: Secondary | ICD-10-CM

## 2021-09-20 DIAGNOSIS — Q859 Phakomatosis, unspecified: Secondary | ICD-10-CM

## 2021-09-20 NOTE — Procedures (Signed)
Small Bowel [place] Capsule Study ?Procedure date:  09/11/21 ? ?Referring Provider:  Aliene Altes, PA-C/Dr. Gala Romney ?PCP:  Dr. Nevada Crane, Edwinna Areola, MD ? 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 Endoscopy today with severe erosive reflux esophagitis, Barrett's esophagus with mild stricture s/p dilation, s/p segmental esophageal biopsies.  ? ?Findings:   ?Capsule was deployed during endoscopy. Complete to the cecum. Scattered blood specks in proximal small bowel due to dilation and biopsies. No obvious AVMs. Towards distal small bowel nearing TI, blood specks also noted. Difficult to appreciate if this was involving small bowel or from upstream, possibly could represent erosions but no ulcerations. 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  ? ?First Gastric image:  N/A ?First Duodenal image: approximately 00:03:20 ?First Cecal image: 3:43:06 ?Gastric Passage time: N/A ?Small Bowel Passage time:  3h 60m? ?Summary & Recommendations: ?Capsule study revealing possible polyp with lymphangiectasia arising from this. In light of his history, would recommend CTE to conclude evaluation. Evidence of blood "specks" likely from dilation and biopsies. Non-specific findings in TI of unclear etiology. ? ?Recommend CTE ?Avoidance of NSAIDs ?Keep scheduled follow-up appt ? ?AAnnitta Needs PhD, ANP-BC ?[facility]  ? ?

## 2021-09-20 NOTE — Telephone Encounter (Signed)
? ? ?  Capsule study reviewed. No obvious AVMs. Scattered blood specks in proximal small bowel due to dilation and biopsies. No obvious AVMs. Towards distal small bowel nearing TI, blood specks also noted. Difficult to appreciate if this was involving small bowel or from upstream, possibly could represent erosions but no ulcerations. 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 ? ?In light of history, recommend CTE.  ? ?Tammy: please let patient know findings (this is a Dr. Gala Romney patient, I just happened to be the one to read the capsule). ? ?[facility] clinical pool: recommend CTE due to possible small bowel polyp, history of hamartomatous polyp.  ? ?Kristen: FYI as you last saw patient and any other further recommendations.  ? ?Magda PaganiniJuluis Rainier as patient scheduled to see you in July.  ?

## 2021-09-20 NOTE — Telephone Encounter (Signed)
No ans, vm full.  

## 2021-09-21 NOTE — Addendum Note (Signed)
Addended by: Zara Council C on: 09/21/2021 11:52 AM ? ? Modules accepted: Orders ? ?

## 2021-09-21 NOTE — Telephone Encounter (Signed)
Pt was made aware and is ready to move forward with scheduling the CTE as recommended.  ?

## 2021-09-21 NOTE — Addendum Note (Signed)
Addended by: Zara Council C on: 09/21/2021 11:58 AM ? ? Modules accepted: Orders ? ?

## 2021-09-21 NOTE — Telephone Encounter (Signed)
PA for CTE submitted via [employer]. Order ID: [id number], valid 09/21/21-10/20/21. ?

## 2021-09-21 NOTE — Telephone Encounter (Signed)
CTE scheduled for 10/20/21 at 5:00pm, arrive at 3:30pm. NPO 4 hours prior to test. Creatinine prior to test. ? ?Called pt, informed him of CTE appt and details. Appt letter and lab order mailed. ?

## 2021-09-22 NOTE — Telephone Encounter (Signed)
Reviewed. No additional recommendations.  ?

## 2021-09-25 NOTE — Telephone Encounter (Signed)
Noted. Will see in July. ?

## 2021-10-11 DIAGNOSIS — I10 Essential (primary) hypertension: Secondary | ICD-10-CM | POA: Diagnosis not present

## 2021-10-11 DIAGNOSIS — E669 Obesity, unspecified: Secondary | ICD-10-CM | POA: Diagnosis not present

## 2021-10-11 DIAGNOSIS — Z6834 Body mass index (BMI) 34.0-34.9, adult: Secondary | ICD-10-CM | POA: Diagnosis not present

## 2021-10-11 DIAGNOSIS — F5221 Male erectile disorder: Secondary | ICD-10-CM | POA: Diagnosis not present

## 2021-10-18 DIAGNOSIS — Q859 Phakomatosis, unspecified: Secondary | ICD-10-CM | POA: Diagnosis not present

## 2021-10-19 LAB — CREATININE, SERUM: Creat: 1.15 mg/dL (ref 0.70–1.35)

## 2021-10-20 ENCOUNTER — Ambulatory Visit (HOSPITAL_COMMUNITY)
Admission: RE | Admit: 2021-10-20 | Discharge: 2021-10-20 | Disposition: A | Discharge status: Home / Self Care | Payer: BC Managed Care – PPO | Source: Ambulatory Visit | Attending: Gastroenterology | Admitting: Gastroenterology

## 2021-10-20 DIAGNOSIS — Q859 Phakomatosis, unspecified: Secondary | ICD-10-CM | POA: Diagnosis not present

## 2021-10-20 DIAGNOSIS — K8020 Calculus of gallbladder without cholecystitis without obstruction: Secondary | ICD-10-CM | POA: Diagnosis not present

## 2021-10-20 DIAGNOSIS — I700 Atherosclerosis of aorta: Secondary | ICD-10-CM | POA: Diagnosis not present

## 2021-10-20 MED ORDER — IOHEXOL 300 MG/ML  SOLN
100.0000 mL | Freq: Once | INTRAMUSCULAR | Status: AC | PRN
Start: 2021-10-20 — End: 2021-10-20
  Administered 2021-10-20: 100 mL via INTRAVENOUS

## 2021-12-12 ENCOUNTER — Ambulatory Visit: Payer: BC Managed Care – PPO | Admitting: Gastroenterology

## 2021-12-12 ENCOUNTER — Encounter: Payer: Self-pay | Admitting: Gastroenterology

## 2021-12-12 VITALS — BP 160/79 | HR 60 | Temp 97.3°F | Ht 70.5 in | Wt 258.4 lb

## 2021-12-12 DIAGNOSIS — K2270 Barrett's esophagus without dysplasia: Secondary | ICD-10-CM | POA: Diagnosis not present

## 2021-12-12 DIAGNOSIS — K2100 Gastro-esophageal reflux disease with esophagitis, without bleeding: Secondary | ICD-10-CM | POA: Diagnosis not present

## 2021-12-12 DIAGNOSIS — Q859 Phakomatosis, unspecified: Secondary | ICD-10-CM

## 2021-12-12 DIAGNOSIS — K219 Gastro-esophageal reflux disease without esophagitis: Secondary | ICD-10-CM | POA: Insufficient documentation

## 2021-12-12 NOTE — Patient Instructions (Signed)
In 1 month, if your reflux is still well controlled, try to cut back on your pantoprazole. Start weaning back to once daily. The first week, I would recommend that every other day you still take twice daily. If your symptoms are still well controlled, then you can go down to once daily.  Call if you have any problems weaning down to once daily. We will be in touch in the near future with further recommendations regarding timing of your next colonoscopy. We will see you back in 1 year for follow-up of acid reflux. Your next upper endoscopy will be in April 2026.

## 2021-12-12 NOTE — Progress Notes (Signed)
GI Office Note    Referring Provider: Celene Squibb, MD Primary Care Physician:  Celene Squibb, MD  Primary Gastroenterologist: Garfield Cornea, MD   Chief Complaint   Chief Complaint  Patient presents with   Follow-up    Doing well on pantoprazole.     History of Present Illness   Ryan Wiley is a 61 y.o. male presenting today for follow-up.  Clinically doing well.  Prior to starting pantoprazole he was taking 10-15 antacids every day for terrible reflux.  Denies any ongoing heartburn.  Dysphagia resolved.  No abdominal pain.  Bowel movements regular.  No blood in stool or melena.  Continues to take pantoprazole twice daily.  Blood pressure up today but he states he has history of white coat syndrome.  He is on blood pressure medication but states its been well maintained.  History of colon polyp removed at time of colonoscopy in December 2022 with pathology showing benign juvenile hamartomatous polyp. EGD and VCE recommended to look for other juvenile/hamartomatous polyps elsewhere as this finding raises the concern for the possibility of a genetic syndrome -such as Juvenile polyposis syndrome and Peutz jeghers syndrome.    EGD April 2023: -Rather severe erosive ulcerative reflux esophagitis. Barrett's esophagus as described mild stricture status post dilation.  -Hiatal hernia. -Status post small bowel capsule deployment; status post esophageal dilation; status post segmental esophageal biopsies -Biopsies consistent with Barrett's, no dysplasia. -Next EGD in 3 years.  Capsule study reviewed. No obvious AVMs. Scattered blood specks in proximal small bowel due to dilation and biopsies. No obvious AVMs. Towards distal small bowel nearing TI, blood specks also noted. Difficult to appreciate if this was involving small bowel or from upstream, possibly could represent erosions but no ulcerations. 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  CTE May  2023 IMPRESSION: 1. No evidence of acute bowel pathology. No discrete small or large bowel masses. 2. Metallic clip in the distal transverse colon in the left upper quadrant, correlate with procedural history. 3. Cholelithiasis. 4. Small bilateral scrotal hydroceles, left greater than right. 5. Aortic Atherosclerosis (ICD10-I70.0).  Colonoscopy December 2022: -One 6 mm polyp at the splenic flexure, removed with a cold snare.  -Diverticulosis involving the sigmoid and descending colon -Pathology revealed benign juvenile hamartomatous type polyp.  No dysplasia.  Medications   Current Outpatient Medications  Medication Sig Dispense Refill   acetaminophen (TYLENOL) 500 MG tablet Take 1,000 mg by mouth daily in the afternoon.     losartan (COZAAR) 50 MG tablet Take 50 mg by mouth daily.     pantoprazole (PROTONIX) 40 MG tablet SMARTSIG:1 Tablet(s) By Mouth Morning-Evening     sildenafil (VIAGRA) 50 MG tablet Take 50 mg by mouth as needed for erectile dysfunction.     No current facility-administered medications for this visit.    Allergies   Allergies as of 12/12/2021 - Review Complete 12/12/2021  Allergen Reaction Noted   Penicillins Hives 04/06/2021         Review of Systems   General: Negative for anorexia, weight loss, fever, chills, fatigue, weakness. ENT: Negative for hoarseness, difficulty swallowing , nasal congestion. CV: Negative for chest pain, angina, palpitations, dyspnea on exertion, peripheral edema.  Respiratory: Negative for dyspnea at rest, dyspnea on exertion, cough, sputum, wheezing.  GI: See history of present illness. GU:  Negative for dysuria, hematuria, urinary incontinence, urinary frequency, nocturnal urination.  Endo: Negative for unusual weight change.  Physical Exam   BP (!) 160/79 (BP Location: Right Arm)   Pulse 60   Temp (!) 97.3 F (36.3 C) (Temporal)   Ht 5' 10.5" (1.791 m)   Wt 258 lb 6.4 oz (117.2 kg)   SpO2 96%   BMI 36.55 kg/m     General: Well-nourished, well-developed in no acute distress.  Eyes: No icterus. Mouth: Oropharyngeal mucosa moist and pink , no lesions erythema or exudate. Neuro: Alert and oriented x 4   Skin: Warm and dry, no jaundice.   Psych: Alert and cooperative, normal mood and affect.  Labs   None  Imaging Studies   No results found.  Assessment   Barrett's esophagus/GERD/dysphagia: Clinically doing much better.  Will need lifelong PPI therapy.  Surveillance EGD in 3 years.  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  EGD and capsule study completed with no findings of additional polyps.  Lymphangiectasia noted at time of capsule study, possibly arising from a polyp. Completed CTE for further evaluation which was unremarkable.  I also looked at the image today personally, suspect prominent appearing lymphangiectasia due to underlying mucosal fold.  As patient only has 1 detected polyp and no family history of juvenile polyposis syndrome or Peutz-Jeghers syndrome, unlikely that he has genetic syndrome.  To discuss case further with Dr. Gala Romney.   PLAN   Patient will try to wean back to once daily pantoprazole in the next couple of months.  He was given instructions.   Next upper endoscopy April 2026 for Barrett's surveillance.   To discuss timing of next colonoscopy with Dr. Gala Romney.  Further recommendations to follow.   Return to the office for GERD in 1 year.  Call sooner if any issues.      Laureen Ochs. Bobby Rumpf, [place], [facility]

## 2022-03-04 ENCOUNTER — Other Ambulatory Visit: Payer: Self-pay | Admitting: Internal Medicine

## 2022-04-06 DIAGNOSIS — I10 Essential (primary) hypertension: Secondary | ICD-10-CM | POA: Diagnosis not present

## 2022-04-13 ENCOUNTER — Other Ambulatory Visit (HOSPITAL_COMMUNITY): Payer: Self-pay | Admitting: Internal Medicine

## 2022-04-13 DIAGNOSIS — I10 Essential (primary) hypertension: Secondary | ICD-10-CM | POA: Diagnosis not present

## 2022-04-13 DIAGNOSIS — Z6834 Body mass index (BMI) 34.0-34.9, adult: Secondary | ICD-10-CM | POA: Diagnosis not present

## 2022-04-13 DIAGNOSIS — E669 Obesity, unspecified: Secondary | ICD-10-CM | POA: Diagnosis not present

## 2022-04-13 DIAGNOSIS — F5221 Male erectile disorder: Secondary | ICD-10-CM | POA: Diagnosis not present

## 2022-04-13 DIAGNOSIS — E782 Mixed hyperlipidemia: Secondary | ICD-10-CM

## 2022-05-03 DIAGNOSIS — E669 Obesity, unspecified: Secondary | ICD-10-CM | POA: Diagnosis not present

## 2022-05-03 DIAGNOSIS — K2100 Gastro-esophageal reflux disease with esophagitis, without bleeding: Secondary | ICD-10-CM | POA: Diagnosis not present

## 2022-05-03 DIAGNOSIS — K635 Polyp of colon: Secondary | ICD-10-CM | POA: Diagnosis not present

## 2022-05-03 DIAGNOSIS — I10 Essential (primary) hypertension: Secondary | ICD-10-CM | POA: Diagnosis not present

## 2022-05-13 ENCOUNTER — Telehealth: Payer: Self-pay | Admitting: Gastroenterology

## 2022-05-13 NOTE — Telephone Encounter (Signed)
Please let pt know I discussed his case with Dr. Gala Romney. He had one benign hamartomatous polyp in the colon. The EGD/capsule did not show any other juvenile hamartomatous polyps. Given this finding, his next colonoscopy would be in 05/2031. Keep ov in 12/2022 as planned.   Manuela Schwartz, please NIC for colonoscopy 05/2031.

## 2022-05-14 NOTE — Telephone Encounter (Signed)
Pt was made aware and verbalized understanding.  

## 2022-05-24 ENCOUNTER — Ambulatory Visit (HOSPITAL_COMMUNITY)
Admission: RE | Admit: 2022-05-24 | Discharge: 2022-05-24 | Disposition: A | Discharge status: Home / Self Care | Payer: BC Managed Care – PPO | Source: Ambulatory Visit | Attending: Internal Medicine | Admitting: Internal Medicine

## 2022-05-24 DIAGNOSIS — E782 Mixed hyperlipidemia: Secondary | ICD-10-CM | POA: Insufficient documentation

## 2022-05-31 DIAGNOSIS — I2510 Atherosclerotic heart disease of native coronary artery without angina pectoris: Secondary | ICD-10-CM | POA: Diagnosis not present

## 2022-05-31 DIAGNOSIS — E669 Obesity, unspecified: Secondary | ICD-10-CM | POA: Diagnosis not present

## 2022-05-31 DIAGNOSIS — E782 Mixed hyperlipidemia: Secondary | ICD-10-CM | POA: Diagnosis not present

## 2022-05-31 DIAGNOSIS — I10 Essential (primary) hypertension: Secondary | ICD-10-CM | POA: Diagnosis not present

## 2022-06-21 DIAGNOSIS — I2510 Atherosclerotic heart disease of native coronary artery without angina pectoris: Secondary | ICD-10-CM | POA: Diagnosis not present

## 2022-06-21 DIAGNOSIS — I10 Essential (primary) hypertension: Secondary | ICD-10-CM | POA: Diagnosis not present

## 2022-06-21 DIAGNOSIS — E669 Obesity, unspecified: Secondary | ICD-10-CM | POA: Diagnosis not present

## 2022-06-21 DIAGNOSIS — Z713 Dietary counseling and surveillance: Secondary | ICD-10-CM | POA: Diagnosis not present

## 2022-07-19 DIAGNOSIS — Z6836 Body mass index (BMI) 36.0-36.9, adult: Secondary | ICD-10-CM | POA: Diagnosis not present

## 2022-07-19 DIAGNOSIS — I2510 Atherosclerotic heart disease of native coronary artery without angina pectoris: Secondary | ICD-10-CM | POA: Diagnosis not present

## 2022-07-19 DIAGNOSIS — E669 Obesity, unspecified: Secondary | ICD-10-CM | POA: Diagnosis not present

## 2022-07-19 DIAGNOSIS — I110 Hypertensive heart disease with heart failure: Secondary | ICD-10-CM | POA: Diagnosis not present

## 2022-07-19 DIAGNOSIS — I10 Essential (primary) hypertension: Secondary | ICD-10-CM | POA: Diagnosis not present

## 2022-09-27 DIAGNOSIS — I10 Essential (primary) hypertension: Secondary | ICD-10-CM | POA: Diagnosis not present

## 2022-10-03 DIAGNOSIS — I10 Essential (primary) hypertension: Secondary | ICD-10-CM | POA: Diagnosis not present

## 2022-10-03 DIAGNOSIS — E669 Obesity, unspecified: Secondary | ICD-10-CM | POA: Diagnosis not present

## 2022-10-03 DIAGNOSIS — K635 Polyp of colon: Secondary | ICD-10-CM | POA: Diagnosis not present

## 2022-10-03 DIAGNOSIS — E782 Mixed hyperlipidemia: Secondary | ICD-10-CM | POA: Diagnosis not present

## 2022-10-03 DIAGNOSIS — I2510 Atherosclerotic heart disease of native coronary artery without angina pectoris: Secondary | ICD-10-CM | POA: Diagnosis not present

## 2022-10-31 IMAGING — CT CT ENTEROGRAPHY (ABD-PELV W/ CM)
2 of 6 series · 15 of 46 positions shown, 17 images · IV contrast (agent unspecified)
Comparison: None Available.

CLINICAL DATA: Possible small bowel polyp, history of hamartomatous
polyp of large intestine. No acute symptoms reported.

EXAM:
CT ABDOMEN AND PELVIS WITH CONTRAST (ENTEROGRAPHY)
TECHNIQUE: Multidetector CT of the abdomen and pelvis during bolus
administration of intravenous contrast. Negative oral contrast was
given.

[Series 3: entero thins · axial · 0.94mm/px · z∈[+864,+1410]mm · 12 of 313 slices shown, 14 images]
[im 20/313  soft-tissue]
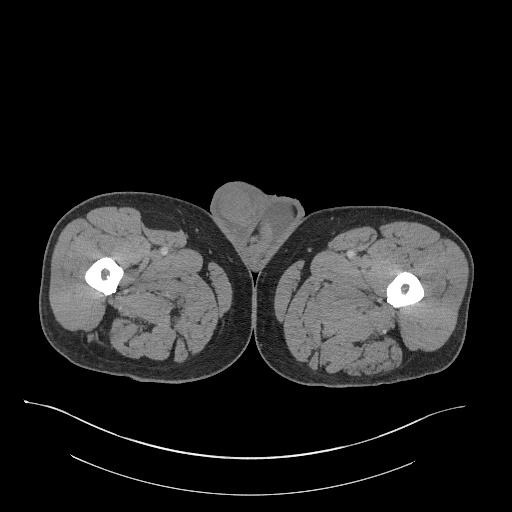
[im 20/313  bone]
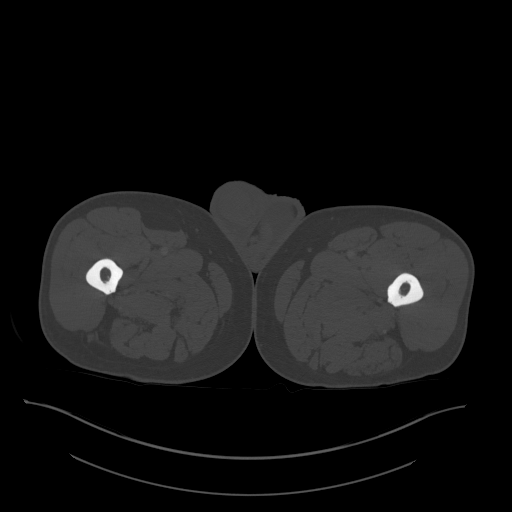
[im 40/313  soft-tissue]
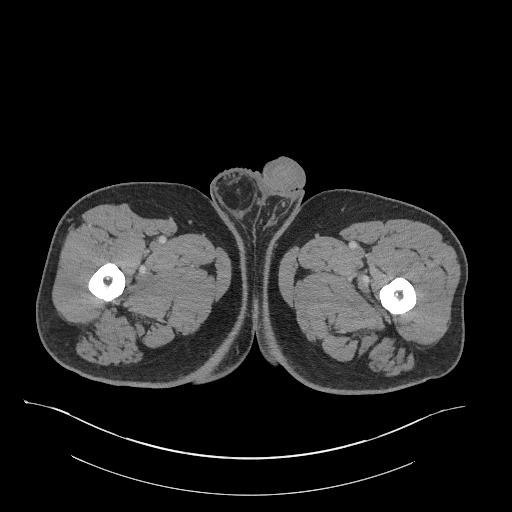
[im 79/313  soft-tissue]
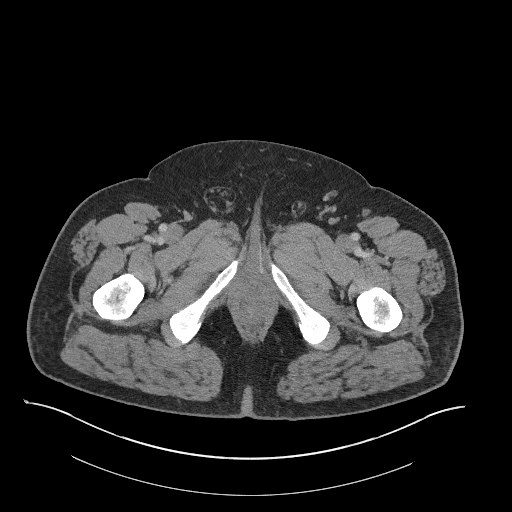
[im 98/313  soft-tissue]
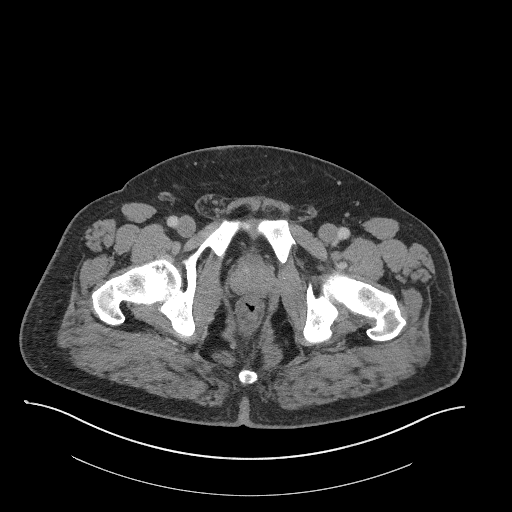
[im 118/313  soft-tissue]
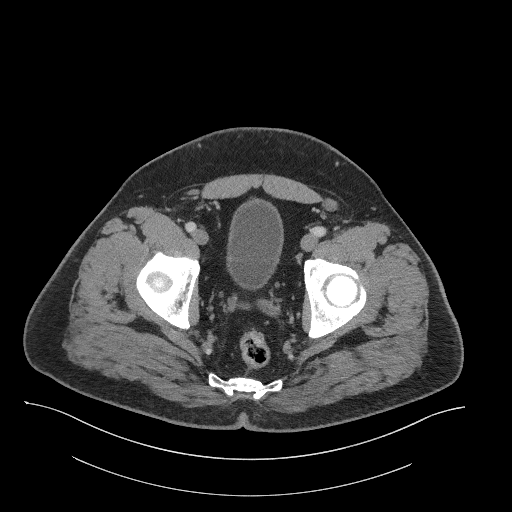
[im 137/313  soft-tissue]
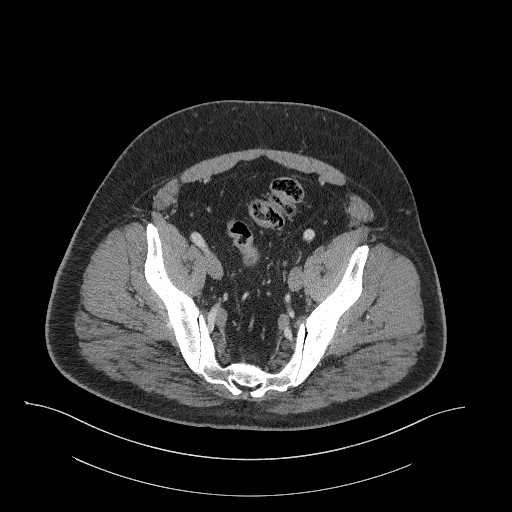
[im 176/313  soft-tissue]
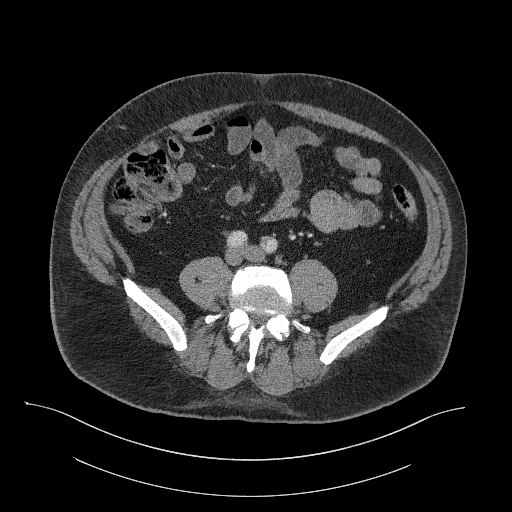
[im 196/313  soft-tissue]
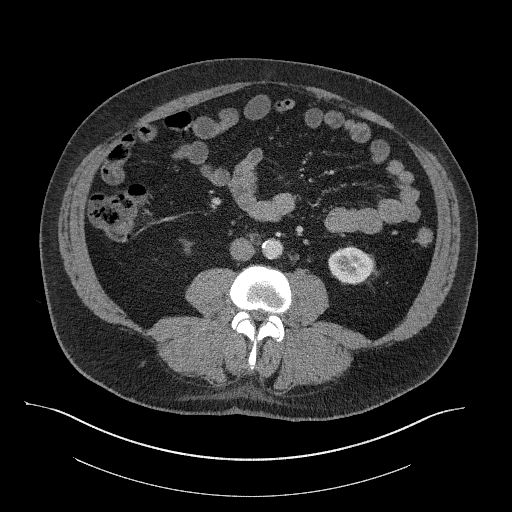
[im 215/313  soft-tissue]
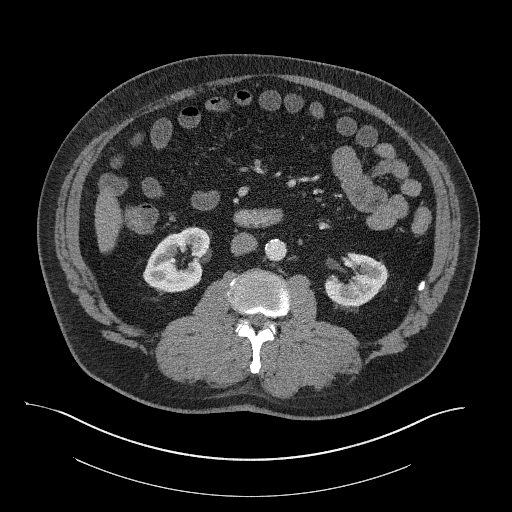
[im 215/313  bone]
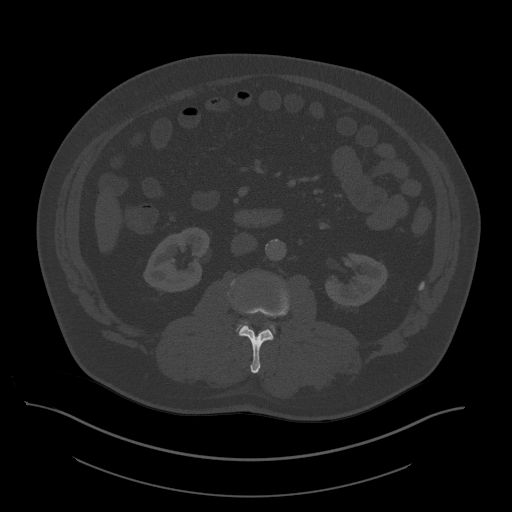
[im 235/313  soft-tissue]
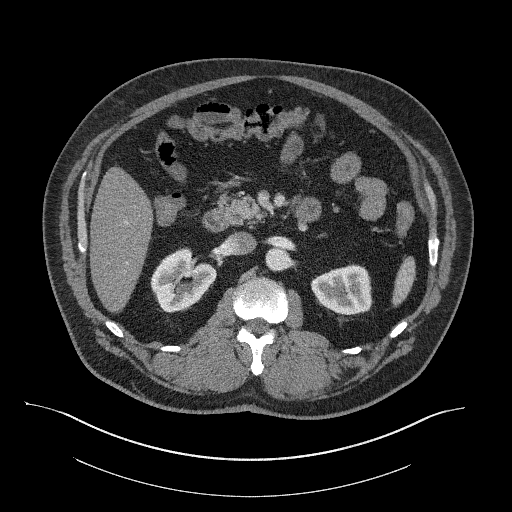
[im 274/313  soft-tissue]
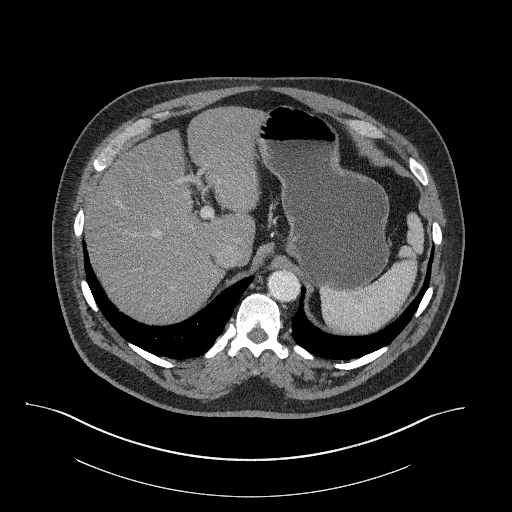
[im 293/313  soft-tissue]
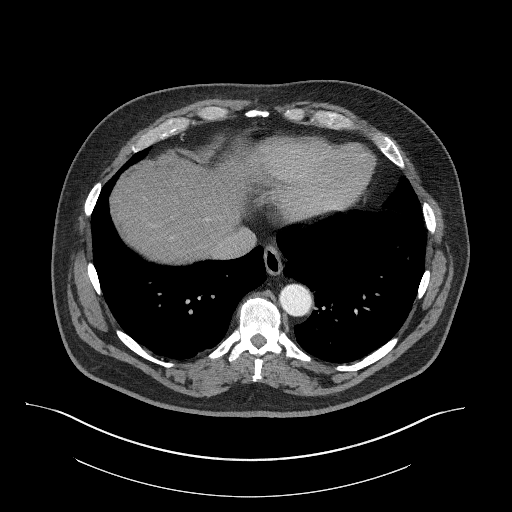

[Series 6: coronal · coronal · 0.90mm/px · 3 of 123 slices shown]
[im 41/123  soft-tissue]
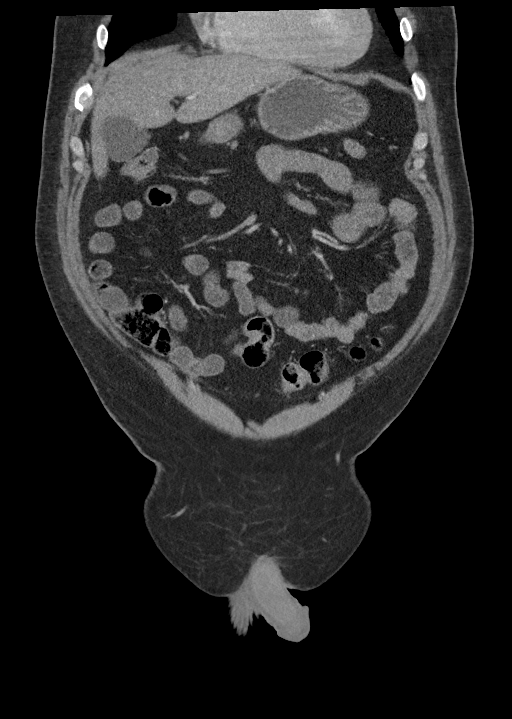
[im 55/123  soft-tissue]
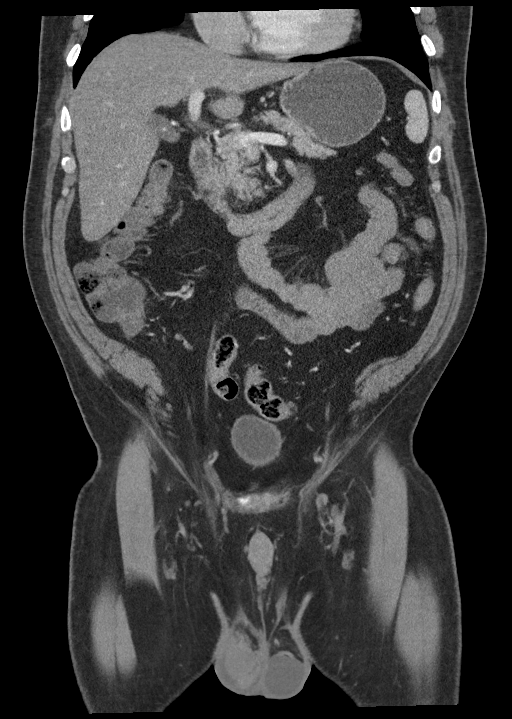
[im 68/123  soft-tissue]
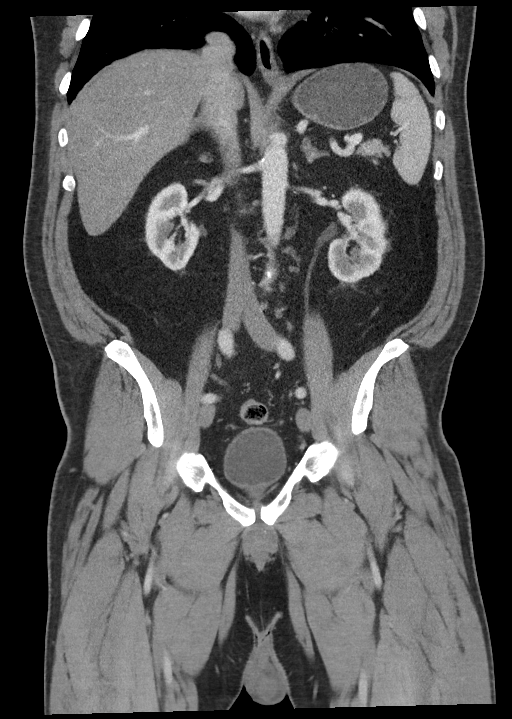

[15 of 46 positions shown; findings below may reference images not displayed]

RADIATION DOSE REDUCTION: This exam was performed according to the
departmental dose-optimization program which includes automated
exposure control, adjustment of the mA and/or kV according to
patient size and/or use of iterative reconstruction technique.

CONTRAST:  100mL OMNIPAQUE IOHEXOL 300 MG/ML  SOLN
FINDINGS: Lower chest: No significant pulmonary nodules or acute consolidative
airspace disease.

Hepatobiliary: Normal liver size. No liver mass. Tiny layering 3 mm
gallstone in the otherwise normal gallbladder, with no gallbladder
wall thickening or pericholecystic fluid. No biliary ductal
dilatation.

Pancreas: Normal, with no mass or duct dilation.

Spleen: Normal size. No mass.

Adrenals/Urinary Tract: Normal adrenals. Normal kidneys with no
hydronephrosis and no renal mass. Normal bladder.

Stomach/Bowel: Normal non-distended stomach. Normal caliber small
bowel loops. Limited fluid distention of the small bowel lumen. No
focal small bowel caliber transition. No small bowel wall thickening
or masses. No bowel pneumatosis. Normal appendix. Normal large bowel
with no diverticulosis, large bowel wall thickening or pericolonic
fat stranding. Metallic clip noted in the distal transverse colon in
the left upper quadrant. No mucosal hyperenhancement in the small or
large bowel.

Vascular/Lymphatic: Atherosclerotic nonaneurysmal abdominal aorta.
Patent portal, splenic, hepatic and renal veins. No pathologically
enlarged lymph nodes in the abdomen or pelvis.

Reproductive: Normal size prostate. Small bilateral scrotal
hydroceles, left greater than right.

Other: No pneumoperitoneum, ascites or focal fluid collection.

Musculoskeletal: No aggressive appearing focal osseous lesions.
Moderate thoracolumbar spondylosis, most prominent at L5-S1.
IMPRESSION: 1. No evidence of acute bowel pathology. No discrete small or large
bowel masses.
2. Metallic clip in the distal transverse colon in the left upper
quadrant, correlate with procedural history.
3. Cholelithiasis.
4. Small bilateral scrotal hydroceles, left greater than right.
5. Aortic Atherosclerosis ([id number].X).

## 2022-11-13 ENCOUNTER — Encounter: Payer: Self-pay | Admitting: Internal Medicine

## 2023-02-22 ENCOUNTER — Other Ambulatory Visit: Payer: Self-pay | Admitting: Gastroenterology
